# Patient Record
Sex: Male | Born: 1955 | Race: White | Hispanic: No | Marital: Married | Smoking: Never smoker
Health system: Southern US, Community
[De-identification: ages and names within clinical notes are randomized; demographics above are authoritative.]

## PROBLEM LIST (undated history)

## (undated) DIAGNOSIS — I10 Essential (primary) hypertension: Secondary | ICD-10-CM

## (undated) DIAGNOSIS — E785 Hyperlipidemia, unspecified: Secondary | ICD-10-CM

## (undated) HISTORY — DX: Hyperlipidemia, unspecified: E78.5

## (undated) HISTORY — PX: KIDNEY STONE SURGERY: SHX686

## (undated) HISTORY — DX: Essential (primary) hypertension: I10

---

## 2000-12-15 ENCOUNTER — Ambulatory Visit (HOSPITAL_COMMUNITY): Admission: RE | Admit: 2000-12-15 | Discharge: 2000-12-15 | Payer: Self-pay | Admitting: Neurology

## 2000-12-15 ENCOUNTER — Encounter: Payer: Self-pay | Admitting: Neurology

## 2011-05-29 ENCOUNTER — Telehealth: Payer: Self-pay

## 2011-05-29 NOTE — Telephone Encounter (Signed)
Received a message from Ginger that pt will call tomorrow after 4:30 pm.

## 2011-05-29 NOTE — Telephone Encounter (Signed)
LMOM at home to call.  

## 2011-06-10 NOTE — Telephone Encounter (Signed)
L/M to call.

## 2011-06-11 NOTE — Telephone Encounter (Signed)
Called pt's cell at [phone]. LMOM to call.

## 2011-06-11 NOTE — Telephone Encounter (Signed)
Mrs. Brailsford called and said to call pt on cell @ [phone].

## 2011-06-11 NOTE — Telephone Encounter (Signed)
LMOM to call.

## 2011-06-20 NOTE — Telephone Encounter (Signed)
Mailed letter to pt to call.  

## 2011-07-24 ENCOUNTER — Telehealth: Payer: Self-pay

## 2011-07-24 NOTE — Telephone Encounter (Signed)
LMOM to call.

## 2011-07-29 NOTE — Telephone Encounter (Signed)
LMOM for a return call. ( Note in referral said his last colonoscopy was done 03/30/2010, but he is not up to date. Please have pt explain.)

## 2011-08-12 NOTE — Telephone Encounter (Signed)
LMOM for a return call.  

## 2012-05-19 ENCOUNTER — Telehealth: Payer: Self-pay

## 2012-05-19 NOTE — Telephone Encounter (Signed)
L/M to call.

## 2012-06-16 NOTE — Telephone Encounter (Signed)
LMOM to call.

## 2012-07-09 NOTE — Telephone Encounter (Signed)
Letter to pt and PCP.  

## 2014-10-03 ENCOUNTER — Encounter: Payer: Self-pay | Admitting: Internal Medicine

## 2014-10-21 ENCOUNTER — Ambulatory Visit (INDEPENDENT_AMBULATORY_CARE_PROVIDER_SITE_OTHER): Payer: BLUE CROSS/BLUE SHIELD | Admitting: Gastroenterology

## 2014-10-21 ENCOUNTER — Other Ambulatory Visit: Payer: Self-pay

## 2014-10-21 ENCOUNTER — Encounter: Payer: Self-pay | Admitting: Gastroenterology

## 2014-10-21 VITALS — BP 146/85 | HR 58 | Temp 97.6°F | Ht 70.0 in | Wt 186.0 lb

## 2014-10-21 DIAGNOSIS — R195 Other fecal abnormalities: Secondary | ICD-10-CM | POA: Diagnosis not present

## 2014-10-21 MED ORDER — PEG 3350-KCL-NA BICARB-NACL 420 G PO SOLR: 4000.0000 mL | Freq: Once | ORAL | Status: DC

## 2014-10-21 NOTE — Assessment & Plan Note (Signed)
59 year old gentleman with Hemoccult-positive stool, void of any GI symptoms otherwise. No prior colonoscopy. Recommend diagnostic colonoscopy in the near future. I have discussed the risks, alternatives, benefits with regards to but not limited to the risk of reaction to medication, bleeding, infection, perforation and the patient is agreeable to proceed. Written consent to be obtained.

## 2014-10-21 NOTE — Progress Notes (Signed)
cc'ed to pcp °

## 2014-10-21 NOTE — Progress Notes (Signed)
Primary Care Physician:  Colette Ribas, MD  Primary Gastroenterologist:  Roetta Sessions, MD   Chief Complaint  Patient presents with  . Colonoscopy    HPI:  Isaiah Esparza is a 59 y.o. male here to schedule diagnostic colonoscopy. Heme positive 2 recently. Denies overt GI bleeding, melena. No constipation or diarrhea. Denies abdominal pain, anorexia, nausea or vomiting, heartburn.  Brother died from esophageal cancer. Sister had breast cancer. Another sister with unknown type cancer. No family history of colon cancer.  Current Outpatient Prescriptions  Medication Sig Dispense Refill  . amLODipine (NORVASC) 10 MG tablet   0  . atorvastatin (LIPITOR) 40 MG tablet   0   No current facility-administered medications for this visit.    Allergies as of 10/21/2014  . (No Known Allergies)    Past Medical History  Diagnosis Date  . Hyperlipidemia   . Hypertension     Past Surgical History  Procedure Laterality Date  . Kidney stone surgery      Family History  Problem Relation Age of Onset  . Heart disease Neg Hx   . Heart attack Neg Hx   . Breast cancer Sister   . Esophageal cancer Brother   . Cancer Sister     ?  . Colon cancer Neg Hx     Social History   Social History  . Marital Status: Married    Spouse Name: N/A  . Number of Children: 1  . Years of Education: N/A   Occupational History  . Not on file.   Social History Main Topics  . Smoking status: Never Smoker   . Smokeless tobacco: Not on file  . Alcohol Use: No  . Drug Use: No  . Sexual Activity: Not on file   Other Topics Concern  . Not on file   Social History Narrative  . No narrative on file      ROS:  General: Negative for anorexia, weight loss, fever, chills, fatigue, weakness. Eyes: Negative for vision changes.  ENT: Negative for hoarseness, difficulty swallowing , nasal congestion. CV: Negative for chest pain, angina, palpitations, dyspnea on exertion, peripheral edema.   Respiratory: Negative for dyspnea at rest, dyspnea on exertion, cough, sputum, wheezing.  GI: See history of present illness. GU:  Negative for dysuria, hematuria, urinary incontinence, urinary frequency, nocturnal urination.  MS: Negative for joint pain, low back pain.  Derm: Negative for rash or itching.  Neuro: Negative for weakness, abnormal sensation, seizure, frequent headaches, memory loss, confusion.  Psych: Negative for anxiety, depression, suicidal ideation, hallucinations.  Endo: Negative for unusual weight change.  Heme: Negative for bruising or bleeding. Allergy: Negative for rash or hives.    Physical Examination:  BP 146/85 mmHg  Pulse 58  Temp(Src) 97.6 F (36.4 C) (Oral)  Ht  (1.778 m)  Wt 186 lb (84.369 kg)  BMI 26.69 kg/m2   General: Well-nourished, well-developed in no acute distress.  Head: Normocephalic, atraumatic.   Eyes: Conjunctiva pink, no icterus. Mouth: Oropharyngeal mucosa moist and pink , no lesions erythema or exudate. Neck: Supple without thyromegaly, masses, or lymphadenopathy.  Lungs: Clear to auscultation bilaterally.  Heart: Regular rate and rhythm, no murmurs rubs or gallops.  Abdomen: Bowel sounds are normal, nontender, nondistended, no hepatosplenomegaly or masses, no abdominal bruits or    hernia , no rebound or guarding.   Rectal: Deferred Extremities: No lower extremity edema. No clubbing or deformities.  Neuro: Alert and oriented x 4 , grossly normal neurologically.  Skin:  Warm and dry, no rash or jaundice.   Psych: Alert and cooperative, normal mood and affect.    Imaging Studies: No results found.

## 2014-10-21 NOTE — Patient Instructions (Signed)
Colonoscopy with Dr. Rourk. See separate instructions. 

## 2014-11-14 ENCOUNTER — Encounter (HOSPITAL_COMMUNITY): Payer: Self-pay | Admitting: *Deleted

## 2014-11-14 ENCOUNTER — Encounter (HOSPITAL_COMMUNITY)
Admission: RE | Disposition: A | Discharge status: Home Health | Payer: Self-pay | Source: Ambulatory Visit | Attending: Internal Medicine

## 2014-11-14 ENCOUNTER — Ambulatory Visit (HOSPITAL_COMMUNITY)
Admission: RE | Admit: 2014-11-14 | Discharge: 2014-11-14 | Disposition: A | Discharge status: Home Health | Payer: BLUE CROSS/BLUE SHIELD | Source: Ambulatory Visit | Attending: Internal Medicine | Admitting: Internal Medicine

## 2014-11-14 DIAGNOSIS — K648 Other hemorrhoids: Secondary | ICD-10-CM | POA: Diagnosis not present

## 2014-11-14 DIAGNOSIS — R195 Other fecal abnormalities: Secondary | ICD-10-CM

## 2014-11-14 DIAGNOSIS — E785 Hyperlipidemia, unspecified: Secondary | ICD-10-CM | POA: Diagnosis not present

## 2014-11-14 DIAGNOSIS — I10 Essential (primary) hypertension: Secondary | ICD-10-CM | POA: Insufficient documentation

## 2014-11-14 DIAGNOSIS — K5730 Diverticulosis of large intestine without perforation or abscess without bleeding: Secondary | ICD-10-CM | POA: Diagnosis not present

## 2014-11-14 DIAGNOSIS — K921 Melena: Secondary | ICD-10-CM | POA: Insufficient documentation

## 2014-11-14 HISTORY — PX: COLONOSCOPY: SHX5424

## 2014-11-14 LAB — CBC
HCT: 41.2 % (ref 39.0–52.0)
Hemoglobin: 14 g/dL (ref 13.0–17.0)
MCH: 30.6 pg (ref 26.0–34.0)
MCHC: 34 g/dL (ref 30.0–36.0)
MCV: 90.2 fL (ref 78.0–100.0)
Platelets: 161 10*3/uL (ref 150–400)
RBC: 4.57 MIL/uL (ref 4.22–5.81)
RDW: 13.4 % (ref 11.5–15.5)
WBC: 4.1 10*3/uL (ref 4.0–10.5)

## 2014-11-14 SURGERY — COLONOSCOPY
Anesthesia: Moderate Sedation

## 2014-11-14 MED ORDER — MEPERIDINE HCL 100 MG/ML IJ SOLN
INTRAMUSCULAR | Status: DC | PRN
  Administered 2014-11-14 (×2): 25 mg via INTRAVENOUS
  Administered 2014-11-14: 50 mg via INTRAVENOUS

## 2014-11-14 MED ORDER — SODIUM CHLORIDE 0.9 % IV SOLN
INTRAVENOUS | Status: DC
  Administered 2014-11-14: 09:00:00 via INTRAVENOUS

## 2014-11-14 MED ORDER — MIDAZOLAM HCL 5 MG/5ML IJ SOLN
INTRAMUSCULAR | Status: AC
  Filled 2014-11-14: qty 10

## 2014-11-14 MED ORDER — STERILE WATER FOR IRRIGATION IR SOLN
Status: DC | PRN
  Administered 2014-11-14: 09:00:00

## 2014-11-14 MED ORDER — MIDAZOLAM HCL 5 MG/5ML IJ SOLN
INTRAMUSCULAR | Status: DC | PRN
Start: 2014-11-14 — End: 2014-11-14
  Administered 2014-11-14 (×2): 1 mg via INTRAVENOUS
  Administered 2014-11-14: 2 mg via INTRAVENOUS

## 2014-11-14 MED ORDER — MEPERIDINE HCL 100 MG/ML IJ SOLN
INTRAMUSCULAR | Status: DC
Start: 2014-11-14 — End: 2014-11-14
  Filled 2014-11-14: qty 2

## 2014-11-14 MED ORDER — ONDANSETRON HCL 4 MG/2ML IJ SOLN
INTRAMUSCULAR | Status: DC | PRN
  Administered 2014-11-14: 4 mg via INTRAVENOUS

## 2014-11-14 MED ORDER — ONDANSETRON HCL 4 MG/2ML IJ SOLN
INTRAMUSCULAR | Status: AC
  Filled 2014-11-14: qty 2

## 2014-11-14 NOTE — Discharge Instructions (Signed)
Colonoscopy Discharge Instructions  Read the instructions outlined below and refer to this sheet in the next few weeks. These discharge instructions provide you with general information on caring for yourself after you leave the hospital. Your doctor may also give you specific instructions. While your treatment has been planned according to the most current medical practices available, unavoidable complications occasionally occur. If you have any problems or questions after discharge, call Dr. Jena Gauss at [phone]. ACTIVITY  You may resume your regular activity, but move at a slower pace for the next 24 hours.   Take frequent rest periods for the next 24 hours.   Walking will help get rid of the air and reduce the bloated feeling in your belly (abdomen).   No driving for 24 hours (because of the medicine (anesthesia) used during the test).    Do not sign any important legal documents or operate any machinery for 24 hours (because of the anesthesia used during the test).  NUTRITION  Drink plenty of fluids.   You may resume your normal diet as instructed by your doctor.   Begin with a light meal and progress to your normal diet. Heavy or fried foods are harder to digest and may make you feel sick to your stomach (nauseated).   Avoid alcoholic beverages for 24 hours or as instructed.  MEDICATIONS  You may resume your normal medications unless your doctor tells you otherwise.  WHAT YOU CAN EXPECT TODAY  Some feelings of bloating in the abdomen.   Passage of more gas than usual.   Spotting of blood in your stool or on the toilet paper.  IF YOU HAD POLYPS REMOVED DURING THE COLONOSCOPY:  No aspirin products for 7 days or as instructed.   No alcohol for 7 days or as instructed.   Eat a soft diet for the next 24 hours.  FINDING OUT THE RESULTS OF YOUR TEST Not all test results are available during your visit. If your test results are not back during the visit, make an appointment  with your caregiver to find out the results. Do not assume everything is normal if you have not heard from your caregiver or the medical facility. It is important for you to follow up on all of your test results.  SEEK IMMEDIATE MEDICAL ATTENTION IF:  You have more than a spotting of blood in your stool.   Your belly is swollen (abdominal distention).   You are nauseated or vomiting.   You have a temperature over 101.   You have abdominal pain or discomfort that is severe or gets worse throughout the day.     Diverticulosis information  CBC today  Repeat colonoscopy for screening purposes in 10 years  Diverticulosis Diverticulosis is the condition that develops when small pouches (diverticula) form in the wall of your colon. Your colon, or large intestine, is where water is absorbed and stool is formed. The pouches form when the inside layer of your colon pushes through weak spots in the outer layers of your colon. CAUSES  No one knows exactly what causes diverticulosis. RISK FACTORS  Being older than 50. Your risk for this condition increases with age. Diverticulosis is rare in people younger than 40 years. By age 30, almost everyone has it.  Eating a low-fiber diet.  Being frequently constipated.  Being overweight.  Not getting enough exercise.  Smoking.  Taking over-the-counter pain medicines, like aspirin and ibuprofen. SYMPTOMS  Most people with diverticulosis do not have symptoms. DIAGNOSIS  Because  diverticulosis often has no symptoms, health care providers often discover the condition during an exam for other colon problems. In many cases, a health care provider will diagnose diverticulosis while using a flexible scope to examine the colon (colonoscopy). TREATMENT  If you have never developed an infection related to diverticulosis, you may not need treatment. If you have had an infection before, treatment may include:  Eating more fruits, vegetables, and  grains.  Taking a fiber supplement.  Taking a live bacteria supplement (probiotic).  Taking medicine to relax your colon. HOME CARE INSTRUCTIONS   Drink at least 6-8 glasses of water each day to prevent constipation.  Try not to strain when you have a bowel movement.  Keep all follow-up appointments. If you have had an infection before:  Increase the fiber in your diet as directed by your health care provider or dietitian.  Take a dietary fiber supplement if your health care provider approves.  Only take medicines as directed by your health care provider. SEEK MEDICAL CARE IF:   You have abdominal pain.  You have bloating.  You have cramps.  You have not gone to the bathroom in 3 days. SEEK IMMEDIATE MEDICAL CARE IF:   Your pain gets worse.  Yourbloating becomes very bad.  You have a fever or chills, and your symptoms suddenly get worse.  You begin vomiting.  You have bowel movements that are bloody or black. MAKE SURE YOU:  Understand these instructions.  Will watch your condition.  Will get help right away if you are not doing well or get worse.

## 2014-11-14 NOTE — Op Note (Signed)
[facility] [address] [place], [address]   COLONOSCOPY PROCEDURE REPORT  PATIENT: Isaiah, Esparza  MR#: [id number] BIRTHDATE: 1955/06/27 , 59  yrs. old GENDER: male ENDOSCOPIST: R.  Roetta Sessions, MD FACP [facility] REFERRED XB:JYNW Phillips Odor, M.D. PROCEDURE DATE:  2014-11-23 PROCEDURE:   Ileo-colonoscopy, diagnostic INDICATIONS:   Hemoccult-positive stool MEDICATIONS: Versed 4 mg IV and Demerol 100 mg IV in divided doses. Zofran 4 mg IV. ASA CLASS:       Class II  CONSENT: The risks, benefits, alternatives and imponderables including but not limited to bleeding, perforation as well as the possibility of a missed lesion have been reviewed.  The potential for biopsy, lesion removal, etc. have also been discussed. Questions have been answered.  All parties agreeable.  Please see the history and physical in the medical record for more information.  DESCRIPTION OF PROCEDURE:   After the risks benefits and alternatives of the procedure were thoroughly explained, informed consent was obtained.  The digital rectal exam revealed no abnormalities of the rectum.   The EC-[id number] ([id number])  endoscope was introduced through the anus and advanced to the terminal ileum which was intubated for a short distance. No adverse events experienced.   The quality of the prep was adequate  The instrument was then slowly withdrawn as the colon was fully examined. Estimated blood loss is zero unless otherwise noted in this procedure report.      COLON FINDINGS: Minimal anal canal/internal hemorrhoids; otherwise, normal-appearing rectal mucosa.  Rare sigmoid diverticula; otherwise, the remainder of the colonic mucosa appeared entirely normal.  The distal 10 cm of terminal ileum mucosa also appeared normal.  Retroflexion was performed. .  Withdrawal time=13 minutes 0 seconds.  The scope was withdrawn and the procedure completed. COMPLICATIONS: There were no immediate  complications.  ENDOSCOPIC IMPRESSION: Minimal internal hemorrhoids. Rare sigmoid diverticula; otherwise normal examination.  RECOMMENDATIONS: Repeat colonoscopy in 10 years for screening purposes. CBC today. Further recommendations to follow.  eSigned:  R. Roetta Sessions, MD Jerrel Ivory [facility] 11-23-14 9:40 AM   cc:  CPT CODES: ICD CODES:  The ICD and CPT codes recommended by this software are interpretations from the data that the clinical staff has captured with the software.  The verification of the translation of this report to the ICD and CPT codes and modifiers is the sole responsibility of the health care institution and practicing physician where this report was generated.  [employer] will not be held responsible for the validity of the ICD and CPT codes included on this report.  AMA assumes no liability for data contained or not contained herein. CPT is a Publishing rights manager of the [employer].  PATIENT NAME:  Isaiah, Esparza MR#: [id number]

## 2014-11-14 NOTE — Interval H&P Note (Signed)
History and Physical Interval Note:  11/14/2014 8:56 AM  Isaiah Esparza  has presented today for surgery, with the diagnosis of heme postive stool  The various methods of treatment have been discussed with the patient and family. After consideration of risks, benefits and other options for treatment, the patient has consented to  Procedure(s) with comments: COLONOSCOPY (N/A) - 0930 as a surgical intervention .  The patient's history has been reviewed, patient examined, no change in status, stable for surgery.  I have reviewed the patient's chart and labs.  Questions were answered to the patient's satisfaction.     Isaiah Esparza  No change. A diagnostic colonoscopy for Hemoccult-positive stool per plan. n The risks, benefits, limitations, alternatives and imponderables have been reviewed with the patient. Questions have been answered. All parties are agreeable.

## 2014-11-14 NOTE — H&P (View-Only) (Signed)
Primary Care Physician:  Colette Ribas, MD  Primary Gastroenterologist:  Roetta Sessions, MD   Chief Complaint  Patient presents with  . Colonoscopy    HPI:  Isaiah Esparza is a 59 y.o. male here to schedule diagnostic colonoscopy. Heme positive 2 recently. Denies overt GI bleeding, melena. No constipation or diarrhea. Denies abdominal pain, anorexia, nausea or vomiting, heartburn.  Brother died from esophageal cancer. Sister had breast cancer. Another sister with unknown type cancer. No family history of colon cancer.  Current Outpatient Prescriptions  Medication Sig Dispense Refill  . amLODipine (NORVASC) 10 MG tablet   0  . atorvastatin (LIPITOR) 40 MG tablet   0   No current facility-administered medications for this visit.    Allergies as of 10/21/2014  . (No Known Allergies)    Past Medical History  Diagnosis Date  . Hyperlipidemia   . Hypertension     Past Surgical History  Procedure Laterality Date  . Kidney stone surgery      Family History  Problem Relation Age of Onset  . Heart disease Neg Hx   . Heart attack Neg Hx   . Breast cancer Sister   . Esophageal cancer Brother   . Cancer Sister     ?  . Colon cancer Neg Hx     Social History   Social History  . Marital Status: Married    Spouse Name: N/A  . Number of Children: 1  . Years of Education: N/A   Occupational History  . Not on file.   Social History Main Topics  . Smoking status: Never Smoker   . Smokeless tobacco: Not on file  . Alcohol Use: No  . Drug Use: No  . Sexual Activity: Not on file   Other Topics Concern  . Not on file   Social History Narrative  . No narrative on file      ROS:  General: Negative for anorexia, weight loss, fever, chills, fatigue, weakness. Eyes: Negative for vision changes.  ENT: Negative for hoarseness, difficulty swallowing , nasal congestion. CV: Negative for chest pain, angina, palpitations, dyspnea on exertion, peripheral edema.   Respiratory: Negative for dyspnea at rest, dyspnea on exertion, cough, sputum, wheezing.  GI: See history of present illness. GU:  Negative for dysuria, hematuria, urinary incontinence, urinary frequency, nocturnal urination.  MS: Negative for joint pain, low back pain.  Derm: Negative for rash or itching.  Neuro: Negative for weakness, abnormal sensation, seizure, frequent headaches, memory loss, confusion.  Psych: Negative for anxiety, depression, suicidal ideation, hallucinations.  Endo: Negative for unusual weight change.  Heme: Negative for bruising or bleeding. Allergy: Negative for rash or hives.    Physical Examination:  BP 146/85 mmHg  Pulse 58  Temp(Src) 97.6 F (36.4 C) (Oral)  Ht  (1.778 m)  Wt 186 lb (84.369 kg)  BMI 26.69 kg/m2   General: Well-nourished, well-developed in no acute distress.  Head: Normocephalic, atraumatic.   Eyes: Conjunctiva pink, no icterus. Mouth: Oropharyngeal mucosa moist and pink , no lesions erythema or exudate. Neck: Supple without thyromegaly, masses, or lymphadenopathy.  Lungs: Clear to auscultation bilaterally.  Heart: Regular rate and rhythm, no murmurs rubs or gallops.  Abdomen: Bowel sounds are normal, nontender, nondistended, no hepatosplenomegaly or masses, no abdominal bruits or    hernia , no rebound or guarding.   Rectal: Deferred Extremities: No lower extremity edema. No clubbing or deformities.  Neuro: Alert and oriented x 4 , grossly normal neurologically.  Skin:  Warm and dry, no rash or jaundice.   Psych: Alert and cooperative, normal mood and affect.    Imaging Studies: No results found.    

## 2014-11-16 ENCOUNTER — Encounter (HOSPITAL_COMMUNITY): Payer: Self-pay | Admitting: Internal Medicine

## 2014-12-08 ENCOUNTER — Ambulatory Visit (INDEPENDENT_AMBULATORY_CARE_PROVIDER_SITE_OTHER): Payer: BLUE CROSS/BLUE SHIELD | Admitting: *Deleted

## 2014-12-08 DIAGNOSIS — Z23 Encounter for immunization: Secondary | ICD-10-CM

## 2015-06-01 DIAGNOSIS — I10 Essential (primary) hypertension: Secondary | ICD-10-CM | POA: Diagnosis not present

## 2015-06-01 DIAGNOSIS — Z1389 Encounter for screening for other disorder: Secondary | ICD-10-CM | POA: Diagnosis not present

## 2015-06-01 DIAGNOSIS — Z6829 Body mass index (BMI) 29.0-29.9, adult: Secondary | ICD-10-CM | POA: Diagnosis not present

## 2015-06-01 DIAGNOSIS — E782 Mixed hyperlipidemia: Secondary | ICD-10-CM | POA: Diagnosis not present

## 2015-08-11 DIAGNOSIS — H5203 Hypermetropia, bilateral: Secondary | ICD-10-CM | POA: Diagnosis not present

## 2016-05-17 DIAGNOSIS — Z1389 Encounter for screening for other disorder: Secondary | ICD-10-CM | POA: Diagnosis not present

## 2016-05-17 DIAGNOSIS — Z0001 Encounter for general adult medical examination with abnormal findings: Secondary | ICD-10-CM | POA: Diagnosis not present

## 2016-05-17 DIAGNOSIS — Z6829 Body mass index (BMI) 29.0-29.9, adult: Secondary | ICD-10-CM | POA: Diagnosis not present

## 2016-05-17 DIAGNOSIS — E663 Overweight: Secondary | ICD-10-CM | POA: Diagnosis not present

## 2016-06-04 DIAGNOSIS — D649 Anemia, unspecified: Secondary | ICD-10-CM | POA: Diagnosis not present

## 2016-07-18 DIAGNOSIS — E663 Overweight: Secondary | ICD-10-CM | POA: Diagnosis not present

## 2016-07-18 DIAGNOSIS — I10 Essential (primary) hypertension: Secondary | ICD-10-CM | POA: Diagnosis not present

## 2016-07-18 DIAGNOSIS — Z1389 Encounter for screening for other disorder: Secondary | ICD-10-CM | POA: Diagnosis not present

## 2016-07-18 DIAGNOSIS — E782 Mixed hyperlipidemia: Secondary | ICD-10-CM | POA: Diagnosis not present

## 2016-07-18 DIAGNOSIS — Z6829 Body mass index (BMI) 29.0-29.9, adult: Secondary | ICD-10-CM | POA: Diagnosis not present

## 2016-09-20 DIAGNOSIS — Z1389 Encounter for screening for other disorder: Secondary | ICD-10-CM | POA: Diagnosis not present

## 2016-09-20 DIAGNOSIS — E663 Overweight: Secondary | ICD-10-CM | POA: Diagnosis not present

## 2016-09-20 DIAGNOSIS — Z6829 Body mass index (BMI) 29.0-29.9, adult: Secondary | ICD-10-CM | POA: Diagnosis not present

## 2016-09-20 DIAGNOSIS — I10 Essential (primary) hypertension: Secondary | ICD-10-CM | POA: Diagnosis not present

## 2016-09-20 DIAGNOSIS — R600 Localized edema: Secondary | ICD-10-CM | POA: Diagnosis not present

## 2016-11-28 DIAGNOSIS — Z23 Encounter for immunization: Secondary | ICD-10-CM | POA: Diagnosis not present

## 2017-03-14 DIAGNOSIS — E782 Mixed hyperlipidemia: Secondary | ICD-10-CM | POA: Diagnosis not present

## 2017-03-14 DIAGNOSIS — Z6829 Body mass index (BMI) 29.0-29.9, adult: Secondary | ICD-10-CM | POA: Diagnosis not present

## 2017-03-14 DIAGNOSIS — Z6830 Body mass index (BMI) 30.0-30.9, adult: Secondary | ICD-10-CM | POA: Diagnosis not present

## 2017-03-14 DIAGNOSIS — Z1389 Encounter for screening for other disorder: Secondary | ICD-10-CM | POA: Diagnosis not present

## 2017-03-14 DIAGNOSIS — I10 Essential (primary) hypertension: Secondary | ICD-10-CM | POA: Diagnosis not present

## 2017-03-14 DIAGNOSIS — R42 Dizziness and giddiness: Secondary | ICD-10-CM | POA: Diagnosis not present

## 2017-03-14 DIAGNOSIS — E6609 Other obesity due to excess calories: Secondary | ICD-10-CM | POA: Diagnosis not present

## 2017-04-03 DIAGNOSIS — E6609 Other obesity due to excess calories: Secondary | ICD-10-CM | POA: Diagnosis not present

## 2017-04-03 DIAGNOSIS — Z6830 Body mass index (BMI) 30.0-30.9, adult: Secondary | ICD-10-CM | POA: Diagnosis not present

## 2017-04-03 DIAGNOSIS — M67823 Other specified disorders of tendon, right elbow: Secondary | ICD-10-CM | POA: Diagnosis not present

## 2017-05-23 DIAGNOSIS — I10 Essential (primary) hypertension: Secondary | ICD-10-CM | POA: Diagnosis not present

## 2017-05-23 DIAGNOSIS — M7711 Lateral epicondylitis, right elbow: Secondary | ICD-10-CM | POA: Diagnosis not present

## 2017-05-23 DIAGNOSIS — Z6830 Body mass index (BMI) 30.0-30.9, adult: Secondary | ICD-10-CM | POA: Diagnosis not present

## 2017-05-23 DIAGNOSIS — Z1389 Encounter for screening for other disorder: Secondary | ICD-10-CM | POA: Diagnosis not present

## 2017-05-23 DIAGNOSIS — Z0001 Encounter for general adult medical examination with abnormal findings: Secondary | ICD-10-CM | POA: Diagnosis not present

## 2017-06-11 DIAGNOSIS — Z1389 Encounter for screening for other disorder: Secondary | ICD-10-CM | POA: Diagnosis not present

## 2017-06-11 DIAGNOSIS — E663 Overweight: Secondary | ICD-10-CM | POA: Diagnosis not present

## 2017-06-11 DIAGNOSIS — H109 Unspecified conjunctivitis: Secondary | ICD-10-CM | POA: Diagnosis not present

## 2017-06-11 DIAGNOSIS — Z6829 Body mass index (BMI) 29.0-29.9, adult: Secondary | ICD-10-CM | POA: Diagnosis not present

## 2018-06-12 DIAGNOSIS — E663 Overweight: Secondary | ICD-10-CM | POA: Diagnosis not present

## 2018-06-12 DIAGNOSIS — E7849 Other hyperlipidemia: Secondary | ICD-10-CM | POA: Diagnosis not present

## 2018-06-12 DIAGNOSIS — I10 Essential (primary) hypertension: Secondary | ICD-10-CM | POA: Diagnosis not present

## 2018-06-12 DIAGNOSIS — Z1389 Encounter for screening for other disorder: Secondary | ICD-10-CM | POA: Diagnosis not present

## 2018-06-12 DIAGNOSIS — Z6827 Body mass index (BMI) 27.0-27.9, adult: Secondary | ICD-10-CM | POA: Diagnosis not present

## 2018-06-12 DIAGNOSIS — Z0001 Encounter for general adult medical examination with abnormal findings: Secondary | ICD-10-CM | POA: Diagnosis not present

## 2018-06-12 DIAGNOSIS — L600 Ingrowing nail: Secondary | ICD-10-CM | POA: Diagnosis not present

## 2018-11-04 DIAGNOSIS — D649 Anemia, unspecified: Secondary | ICD-10-CM | POA: Diagnosis not present

## 2018-11-04 DIAGNOSIS — E6609 Other obesity due to excess calories: Secondary | ICD-10-CM | POA: Diagnosis not present

## 2018-11-04 DIAGNOSIS — E7849 Other hyperlipidemia: Secondary | ICD-10-CM | POA: Diagnosis not present

## 2018-11-04 DIAGNOSIS — I10 Essential (primary) hypertension: Secondary | ICD-10-CM | POA: Diagnosis not present

## 2018-11-06 DIAGNOSIS — Z1389 Encounter for screening for other disorder: Secondary | ICD-10-CM | POA: Diagnosis not present

## 2018-11-06 DIAGNOSIS — D649 Anemia, unspecified: Secondary | ICD-10-CM | POA: Diagnosis not present

## 2018-11-20 DIAGNOSIS — D649 Anemia, unspecified: Secondary | ICD-10-CM | POA: Diagnosis not present

## 2018-11-27 DIAGNOSIS — Z1211 Encounter for screening for malignant neoplasm of colon: Secondary | ICD-10-CM | POA: Diagnosis not present

## 2018-12-02 DIAGNOSIS — Z23 Encounter for immunization: Secondary | ICD-10-CM | POA: Diagnosis not present

## 2019-01-28 DIAGNOSIS — Z20828 Contact with and (suspected) exposure to other viral communicable diseases: Secondary | ICD-10-CM | POA: Diagnosis not present

## 2019-02-01 DIAGNOSIS — Z20828 Contact with and (suspected) exposure to other viral communicable diseases: Secondary | ICD-10-CM | POA: Diagnosis not present

## 2021-07-27 ENCOUNTER — Other Ambulatory Visit: Payer: Self-pay

## 2021-07-27 ENCOUNTER — Encounter (HOSPITAL_COMMUNITY): Payer: Self-pay | Admitting: Emergency Medicine

## 2021-07-27 ENCOUNTER — Emergency Department (HOSPITAL_COMMUNITY)
Admission: EM | Admit: 2021-07-27 | Discharge: 2021-07-27 | Disposition: A | Discharge status: Home / Self Care | Payer: 59 | Attending: Emergency Medicine | Admitting: Emergency Medicine

## 2021-07-27 ENCOUNTER — Emergency Department (HOSPITAL_COMMUNITY): Payer: 59

## 2021-07-27 DIAGNOSIS — W1830XA Fall on same level, unspecified, initial encounter: Secondary | ICD-10-CM | POA: Insufficient documentation

## 2021-07-27 DIAGNOSIS — I10 Essential (primary) hypertension: Secondary | ICD-10-CM | POA: Diagnosis not present

## 2021-07-27 DIAGNOSIS — S52122A Displaced fracture of head of left radius, initial encounter for closed fracture: Secondary | ICD-10-CM | POA: Diagnosis not present

## 2021-07-27 DIAGNOSIS — S52109A Unspecified fracture of upper end of unspecified radius, initial encounter for closed fracture: Secondary | ICD-10-CM

## 2021-07-27 DIAGNOSIS — Y92007 Garden or yard of unspecified non-institutional (private) residence as the place of occurrence of the external cause: Secondary | ICD-10-CM | POA: Diagnosis not present

## 2021-07-27 DIAGNOSIS — S59902A Unspecified injury of left elbow, initial encounter: Secondary | ICD-10-CM | POA: Diagnosis present

## 2021-07-27 MED ORDER — HYDROCODONE-ACETAMINOPHEN 5-325 MG PO TABS: ORAL_TABLET | ORAL | 0 refills | Status: DC

## 2021-07-27 NOTE — Discharge Instructions (Signed)
Your x-ray shows that you have a broken bone near your left elbow.  Keep the sling in place.  Someone from the orthopedic office will call you on Monday to arrange a appointment for Tuesday or Wednesday.  If you do not hear from someone by Monday afternoon please call the office to arrange an appointment.  You may apply ice packs on and off to your elbow area.  Elevate when possible.

## 2021-07-27 NOTE — ED Provider Notes (Signed)
[facility] EMERGENCY DEPARTMENT Provider Note   CSN: [id number] Arrival date & time: 07/27/21  1619     History  Chief Complaint  Patient presents with   Elbow Pain    Isaiah Esparza is a 66 y.o. male.  HPI      Isaiah Esparza is a 66 y.o. male with past medical history of hyperlipidemia and hypertension who presents to the Emergency Department complaining of left elbow/upper forearm pain after a mechanical fall that occurred earlier today.  He states he fell in the yard while using a leaf blower and landed on his left arm.  Describes pain associated with movement of his elbow and with palpation of the proximal forearm.  He denies any shoulder or wrist pain.  No head injury or LOC.  He denies any numbness or tingling of his fingers does not take blood thinners.   Home Medications Prior to Admission medications   Medication Sig Start Date End Date Taking? Authorizing Provider  amLODipine (NORVASC) 10 MG tablet Take 10 mg by mouth daily.  07/19/14   [provider]  atorvastatin (LIPITOR) 40 MG tablet Take 40 mg by mouth at bedtime.  08/03/14   [provider]  polyethylene glycol-electrolytes (NULYTELY/GOLYTELY) 420 G solution Take 4,000 mLs by mouth once. 10/21/14   Tiffany Kocher, PA-C      Allergies    Patient has no known allergies.    Review of Systems   Review of Systems  Constitutional:  Negative for fever.  Respiratory:  Negative for shortness of breath.   Cardiovascular:  Negative for chest pain.  Gastrointestinal:  Negative for nausea and vomiting.  Musculoskeletal:  Positive for arthralgias (Left elbow forearm pain). Negative for back pain, neck pain and neck stiffness.  Skin:  Negative for color change.  Neurological:  Negative for dizziness, syncope, weakness, numbness and headaches.    Physical Exam Updated Vital Signs BP (!) 154/93 (BP Location: Right Arm)   Pulse 99   Temp 97.7 F (36.5 C) (Oral)   Resp 18   Ht 5\' 11"  (1.803 m)    Wt 95.3 kg   SpO2 95%   BMI 29.29 kg/m  Physical Exam Vitals and nursing note reviewed.  Constitutional:      General: He is not in acute distress.    Appearance: He is not ill-appearing or toxic-appearing.  Cardiovascular:     Rate and Rhythm: Normal rate and regular rhythm.     Pulses: Normal pulses.  Pulmonary:     Effort: Pulmonary effort is normal.  Musculoskeletal:        General: Swelling, tenderness and signs of injury present. No deformity.     Left elbow: No deformity. Normal range of motion. No tenderness.     Left forearm: Swelling and tenderness present.     Cervical back: Normal range of motion. No tenderness.     Comments: Tender to palpation of the proximal left forearm, mild edema noted.  Compartments are soft.  No tenderness of the lateral epicondyles.  No bony deformity.  Upper arm and shoulder are nontender.  Skin:    General: Skin is warm.     Capillary Refill: Capillary refill takes less than 2 seconds.     Findings: No rash.  Neurological:     General: No focal deficit present.     Mental Status: He is alert.     Sensory: No sensory deficit.     Motor: No weakness.     ED  Results / Procedures / Treatments   Labs (all labs ordered are listed, but only abnormal results are displayed) Labs Reviewed - No data to display  EKG None  Radiology DG Elbow Complete Left  Result Date: 07/27/2021 CLINICAL DATA:  Status post fall. EXAM: LEFT ELBOW - COMPLETE 3+ VIEW COMPARISON:  None Available. FINDINGS: An ill-defined area of cortical irregularity is seen involving the neck of the proximal left radius. There is no evidence of dislocation. There is no evidence of arthropathy or other focal bone abnormality. Soft tissues are unremarkable. IMPRESSION: Findings suspicious for a nondisplaced fracture of the proximal left radius. Electronically Signed   By: Aram Candela M.D.   On: 07/27/2021 17:06   DG Forearm Left  Result Date: 07/27/2021 CLINICAL DATA:   Status post fall. EXAM: LEFT FOREARM - 2 VIEW COMPARISON:  None Available. FINDINGS: An acute nondisplaced fracture is seen involving the neck of the proximal left radius. There is no evidence of dislocation. Soft tissues are unremarkable. IMPRESSION: Nondisplaced fracture of the proximal left radius. Electronically Signed   By: Aram Candela M.D.   On: 07/27/2021 16:59    Procedures Procedures    Medications Ordered in ED Medications - No data to display  ED Course/ Medical Decision Making/ A&P                           Medical Decision Making Amount and/or Complexity of Data Reviewed Radiology: ordered.   Patient here for evaluation of left elbow and forearm pain after mechanical fall earlier today.  Denies other injuries.  Does not take blood thinners.  On exam, patient well-appearing nontoxic.  He has focal tenderness to the proximal left forearm.  No tenderness over the lateral epicondyles.  No bony deformity on exam.  Neurovascularly intact.  He is able to range of motion with the left elbow, but produces pain.  X-ray of the elbow shows nondisplaced fracture of the proximal left radius at the level of the neck.  I discussed findings with orthopedics, Dr. Aretha Parrot who recommends patient have sling and he will follow-up in clinic Tuesday or Wednesday of next week.  Discussed this with patient and he is agreeable to plan.        Final Clinical Impression(s) / ED Diagnoses Final diagnoses:  Closed fracture of proximal end of radius without additional fracture    Rx / DC Orders ED Discharge Orders     None         Pauline Aus, PA-C 07/27/21 Pricilla Riffle, MD 07/30/21 1241

## 2021-07-27 NOTE — ED Triage Notes (Signed)
Pt to ER states he fell in the yard landing on concrete on his left arm.  Pt denies hitting head or LOC.  Pt endorses worst pain to elbow.

## 2021-07-31 ENCOUNTER — Encounter: Payer: Self-pay | Admitting: Orthopedic Surgery

## 2021-07-31 ENCOUNTER — Ambulatory Visit (INDEPENDENT_AMBULATORY_CARE_PROVIDER_SITE_OTHER): Payer: 59 | Admitting: Orthopedic Surgery

## 2021-07-31 VITALS — BP 140/89 | HR 72 | Ht 71.0 in | Wt 210.0 lb

## 2021-07-31 DIAGNOSIS — M25522 Pain in left elbow: Secondary | ICD-10-CM | POA: Diagnosis not present

## 2021-07-31 NOTE — Progress Notes (Signed)
New Patient Visit  Assessment: Isaiah Esparza is a 66 y.o. male with the following: 1. Pain in left elbow  Plan: Isaiah Esparza sustained a fall in the last week.  He has some bruising on the volar aspect the left forearm.  Radiographs suggested possible fracture at the base of the radial head.  He is not tender in this area.  His pain is primarily musculature at this point.  He does not need to continue using the sling.  Medications as needed.  Work on range of motion.  Anticipate strength and range of motion will gradually resolve.  If he continues to have issues I have asked him to contact clinic.  Follow-up as needed.  Follow-up: Return if symptoms worsen or fail to improve.  Subjective:  Chief Complaint  Patient presents with   Elbow Pain    Left fell on 07/27/21    History of Present Illness: Isaiah Esparza is a 66 y.o. male who presents for evaluation of left elbow pain.  Just a few days ago, he states he lost his balance, and fell directly onto his left elbow.  He had pain.  He presented to the emergency department.  Radiographs were concerning for fracture.  He has been wearing a sling.  He is not taking medications.  He noticed bruising over the volar forearm.  He has some tenderness within the muscles of the proximal forearm.  No numbness or tingling.   Review of Systems: No fevers or chills No numbness or tingling No chest pain No shortness of breath No bowel or bladder dysfunction No GI distress No headaches   Medical History:  Past Medical History:  Diagnosis Date   Hyperlipidemia    Hypertension     Past Surgical History:  Procedure Laterality Date   COLONOSCOPY N/A 11/14/2014   Procedure: COLONOSCOPY;  Surgeon: Corbin Ade, MD;  Location: [facility];  Service: Endoscopy;  Laterality: N/A;  0930   KIDNEY STONE SURGERY      Family History  Problem Relation Age of Onset   Heart disease Neg Hx    Heart attack Neg Hx    Breast cancer Sister     Esophageal cancer Brother    Cancer Sister        ?   Colon cancer Neg Hx    Social History   Tobacco Use   Smoking status: Never  Substance Use Topics   Alcohol use: No    Alcohol/week: 0.0 standard drinks of alcohol   Drug use: No    No Known Allergies  Current Meds  Medication Sig   amlodipine-olmesartan (AZOR) 10-20 MG tablet Take 1 tablet by mouth daily.   atorvastatin (LIPITOR) 40 MG tablet Take 40 mg by mouth at bedtime.     Objective: BP 140/89   Pulse 72   Ht 5\' 11"  (1.803 m)   Wt 210 lb (95.3 kg)   BMI 29.29 kg/m   Physical Exam:  General: Alert and oriented. and No acute distress. Gait: Normal gait.  Left arm without swelling.  There is ecchymosis over the proximal volar forearm.  Tenderness to palpation within this area.  Tenderness within the whole lot of the left forearm.  No tenderness to palpation directly over the radial head.  Near full range of motion of the left elbow.  Sensation is intact throughout the left hand.  2+ radial pulse.  IMAGING: I personally reviewed images previously obtained from the ED  X-ray of the left  elbow was obtained in the emergency department.  No dislocation.  Possible cortical disruption at the base of the radial head, otherwise x-rays are negative.  New Medications:  No orders of the defined types were placed in this encounter.     Oliver Barre, MD  07/31/2021 4:57 PM

## 2022-05-30 DIAGNOSIS — E6609 Other obesity due to excess calories: Secondary | ICD-10-CM | POA: Diagnosis not present

## 2022-05-30 DIAGNOSIS — I10 Essential (primary) hypertension: Secondary | ICD-10-CM | POA: Diagnosis not present

## 2022-05-30 DIAGNOSIS — Z1331 Encounter for screening for depression: Secondary | ICD-10-CM | POA: Diagnosis not present

## 2022-05-30 DIAGNOSIS — E782 Mixed hyperlipidemia: Secondary | ICD-10-CM | POA: Diagnosis not present

## 2022-05-30 DIAGNOSIS — Z6831 Body mass index (BMI) 31.0-31.9, adult: Secondary | ICD-10-CM | POA: Diagnosis not present

## 2022-05-30 DIAGNOSIS — M542 Cervicalgia: Secondary | ICD-10-CM | POA: Diagnosis not present

## 2022-09-13 DIAGNOSIS — E782 Mixed hyperlipidemia: Secondary | ICD-10-CM | POA: Diagnosis not present

## 2022-09-13 DIAGNOSIS — D485 Neoplasm of uncertain behavior of skin: Secondary | ICD-10-CM | POA: Diagnosis not present

## 2022-09-13 DIAGNOSIS — Z6829 Body mass index (BMI) 29.0-29.9, adult: Secondary | ICD-10-CM | POA: Diagnosis not present

## 2022-09-13 DIAGNOSIS — I10 Essential (primary) hypertension: Secondary | ICD-10-CM | POA: Diagnosis not present

## 2022-09-13 DIAGNOSIS — E6609 Other obesity due to excess calories: Secondary | ICD-10-CM | POA: Diagnosis not present

## 2022-09-13 DIAGNOSIS — E7849 Other hyperlipidemia: Secondary | ICD-10-CM | POA: Diagnosis not present

## 2022-09-13 DIAGNOSIS — Z1331 Encounter for screening for depression: Secondary | ICD-10-CM | POA: Diagnosis not present

## 2022-09-13 DIAGNOSIS — Z0001 Encounter for general adult medical examination with abnormal findings: Secondary | ICD-10-CM | POA: Diagnosis not present

## 2023-10-23 DIAGNOSIS — Z1331 Encounter for screening for depression: Secondary | ICD-10-CM | POA: Diagnosis not present

## 2023-10-23 DIAGNOSIS — E6609 Other obesity due to excess calories: Secondary | ICD-10-CM | POA: Diagnosis not present

## 2023-10-23 DIAGNOSIS — Z6831 Body mass index (BMI) 31.0-31.9, adult: Secondary | ICD-10-CM | POA: Diagnosis not present

## 2023-10-23 DIAGNOSIS — I10 Essential (primary) hypertension: Secondary | ICD-10-CM | POA: Diagnosis not present

## 2023-10-23 DIAGNOSIS — E782 Mixed hyperlipidemia: Secondary | ICD-10-CM | POA: Diagnosis not present

## 2023-10-23 DIAGNOSIS — Z0001 Encounter for general adult medical examination with abnormal findings: Secondary | ICD-10-CM | POA: Diagnosis not present

## 2024-03-26 IMAGING — DX DG FOREARM 2V*L*
2 series · 2 of 2 positions shown · non-contrast
Comparison: None Available.

CLINICAL DATA: Status post fall.

EXAM:
LEFT FOREARM - 2 VIEW

[forearm ap]
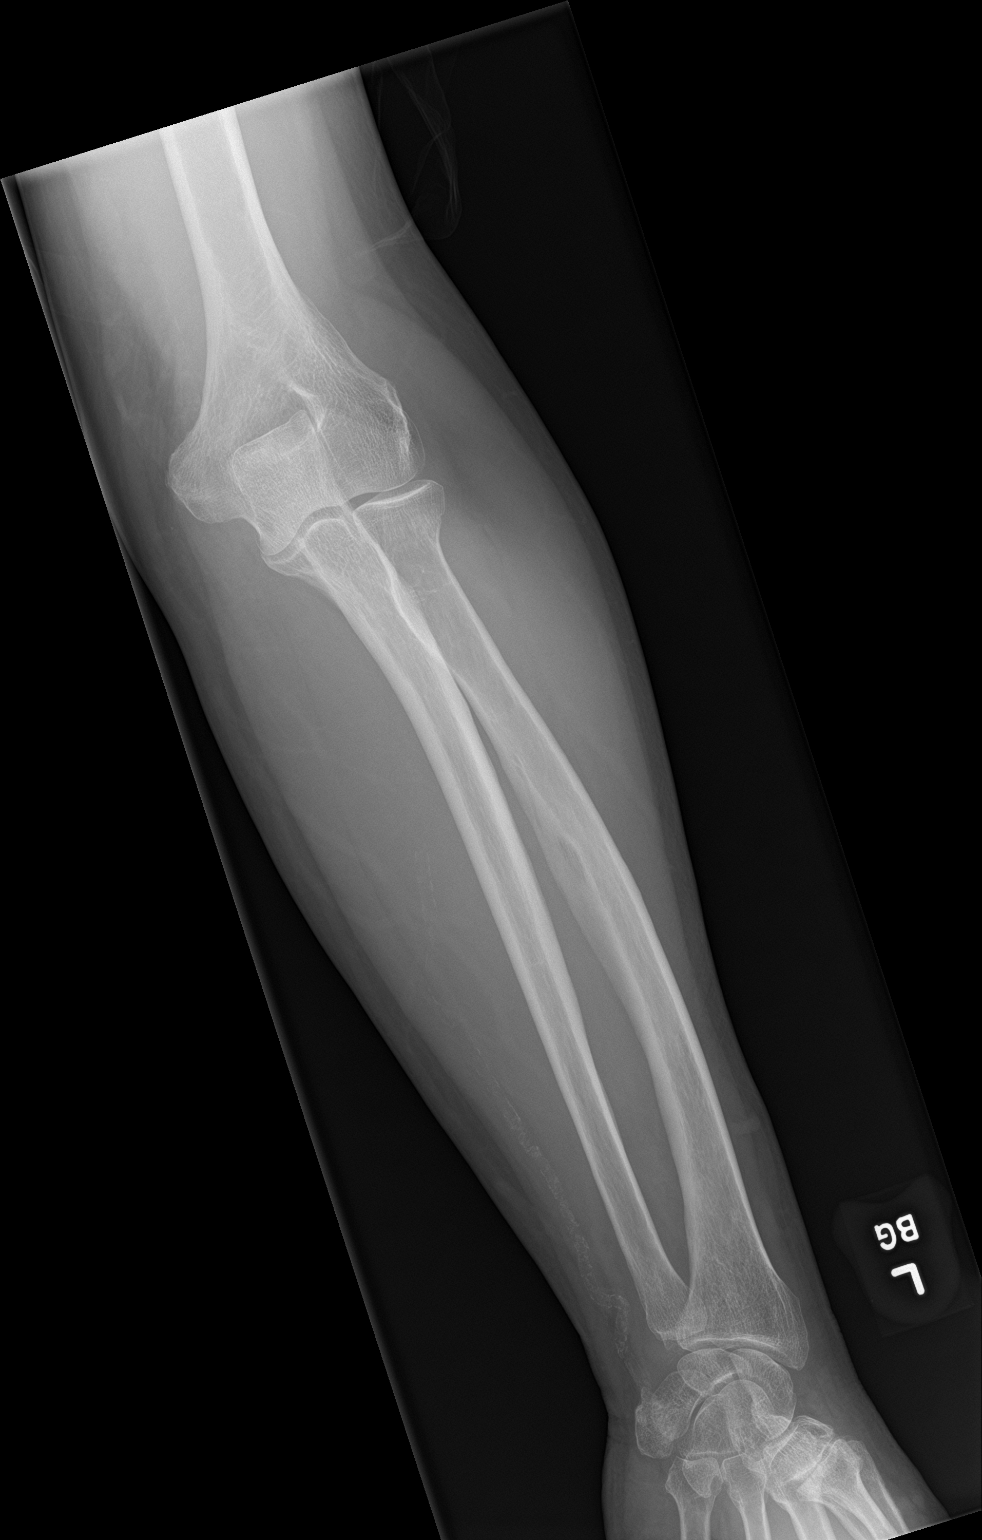

[forearm lat]
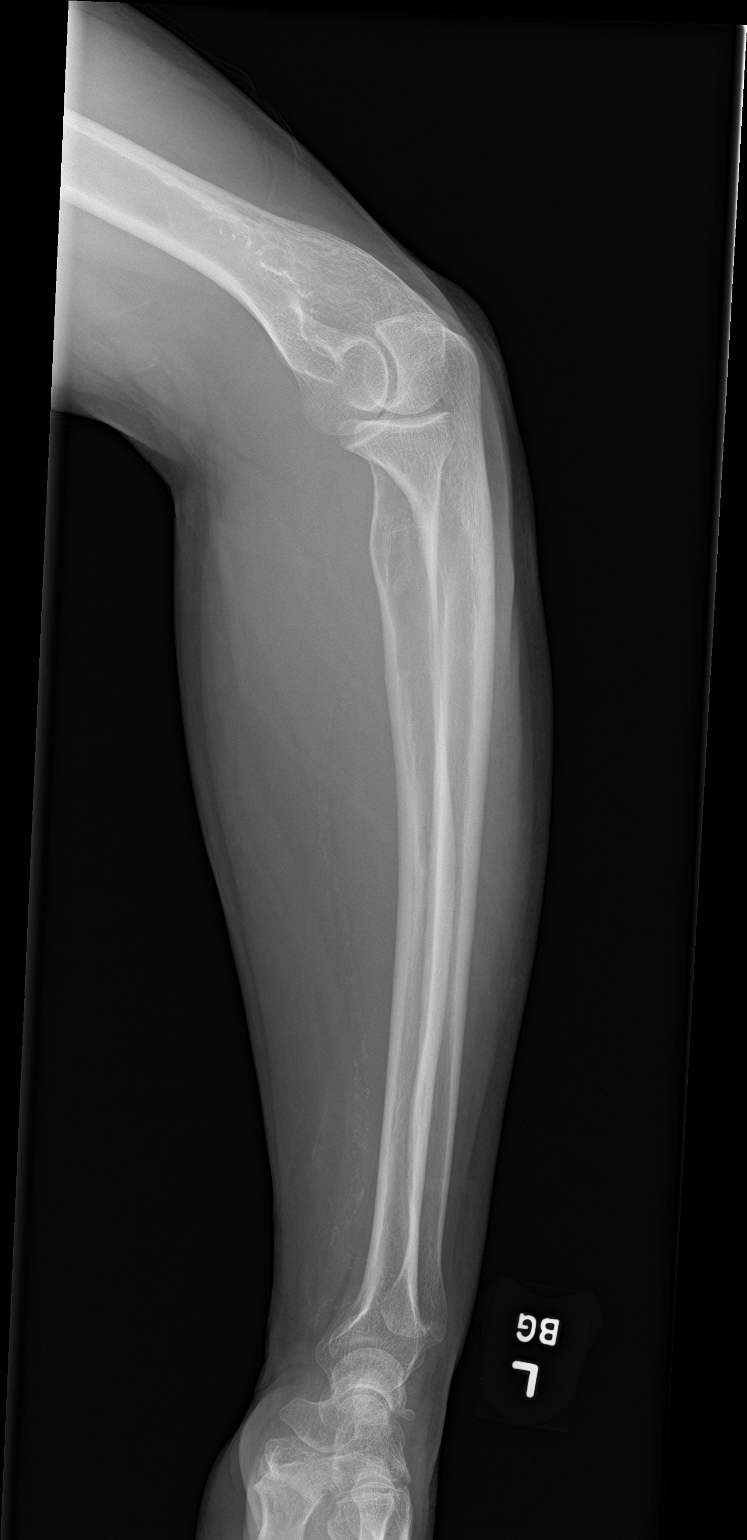

[2 of 2 positions shown; findings below may reference images not displayed]

FINDINGS: An acute nondisplaced fracture is seen involving the neck of the
proximal left radius. There is no evidence of dislocation. Soft
tissues are unremarkable.
IMPRESSION: Nondisplaced fracture of the proximal left radius.

## 2024-03-26 IMAGING — DX DG ELBOW COMPLETE 3+V*L*
4 series · 4 of 4 positions shown · non-contrast
Comparison: None Available.

CLINICAL DATA: Status post fall.

EXAM:
LEFT ELBOW - COMPLETE 3+ VIEW

[elbow ap]
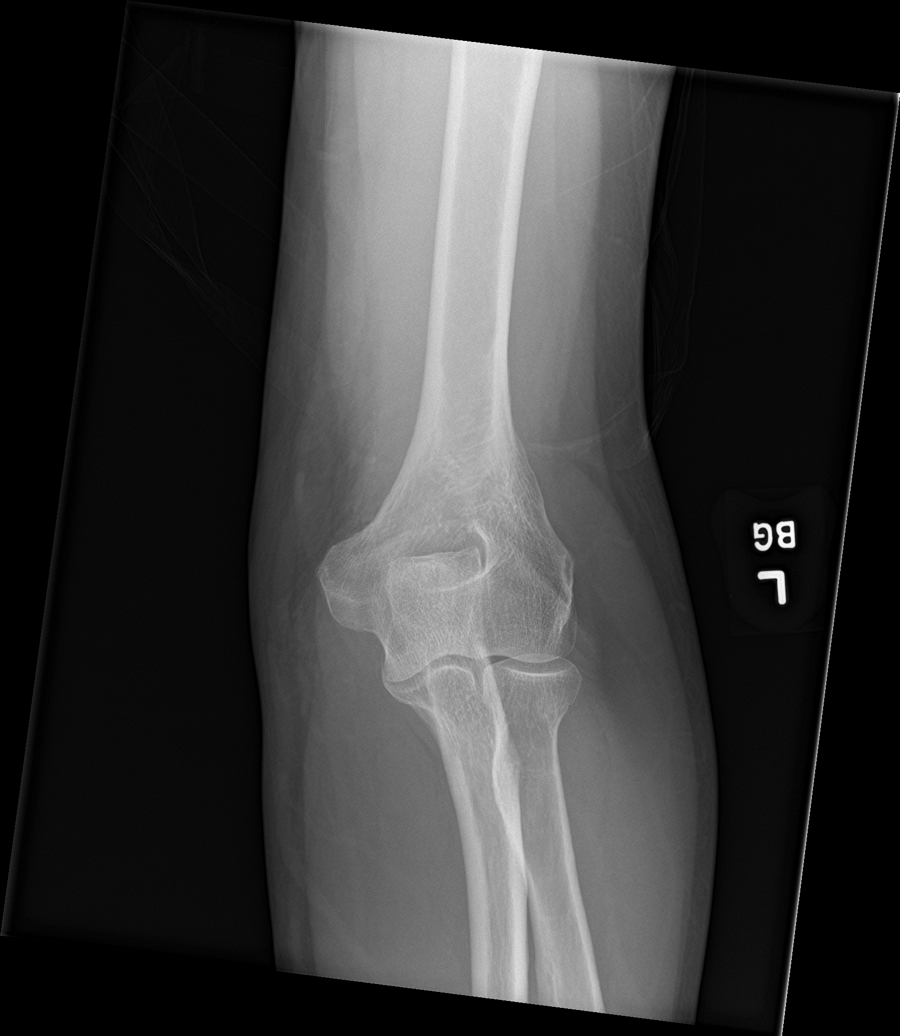

[elbow obl (1 of 2)]
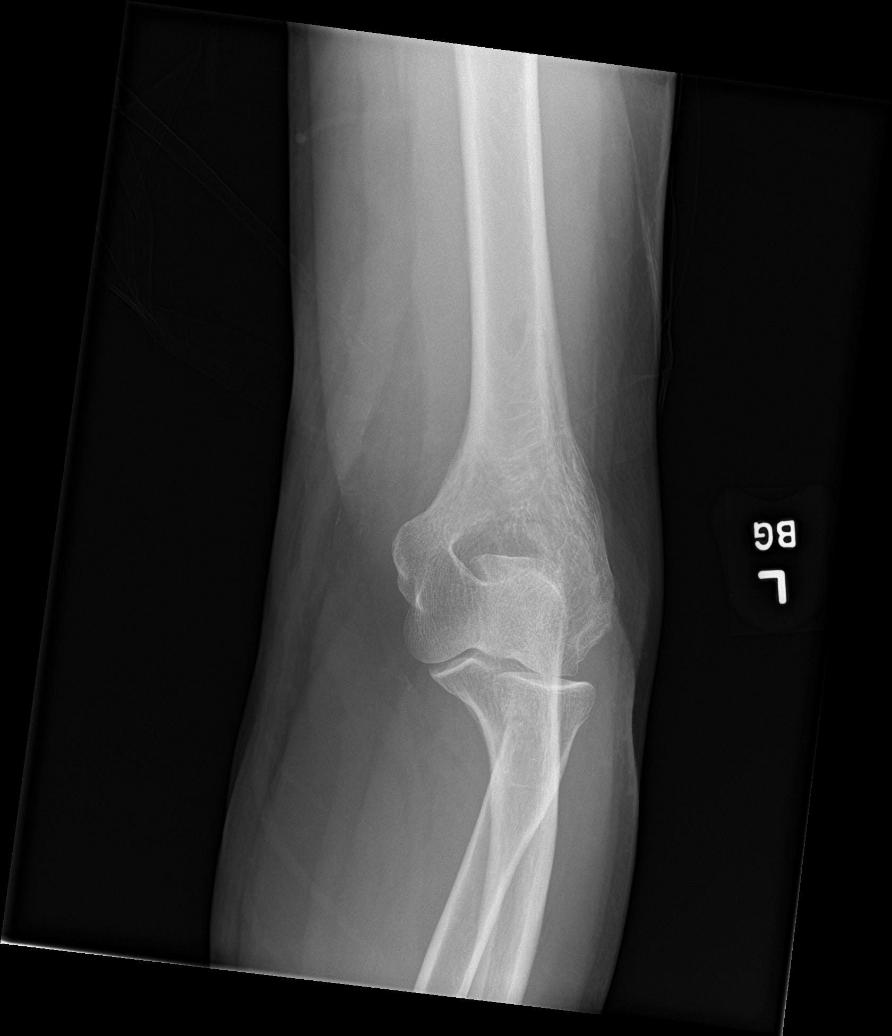

[elbow obl (2 of 2)]
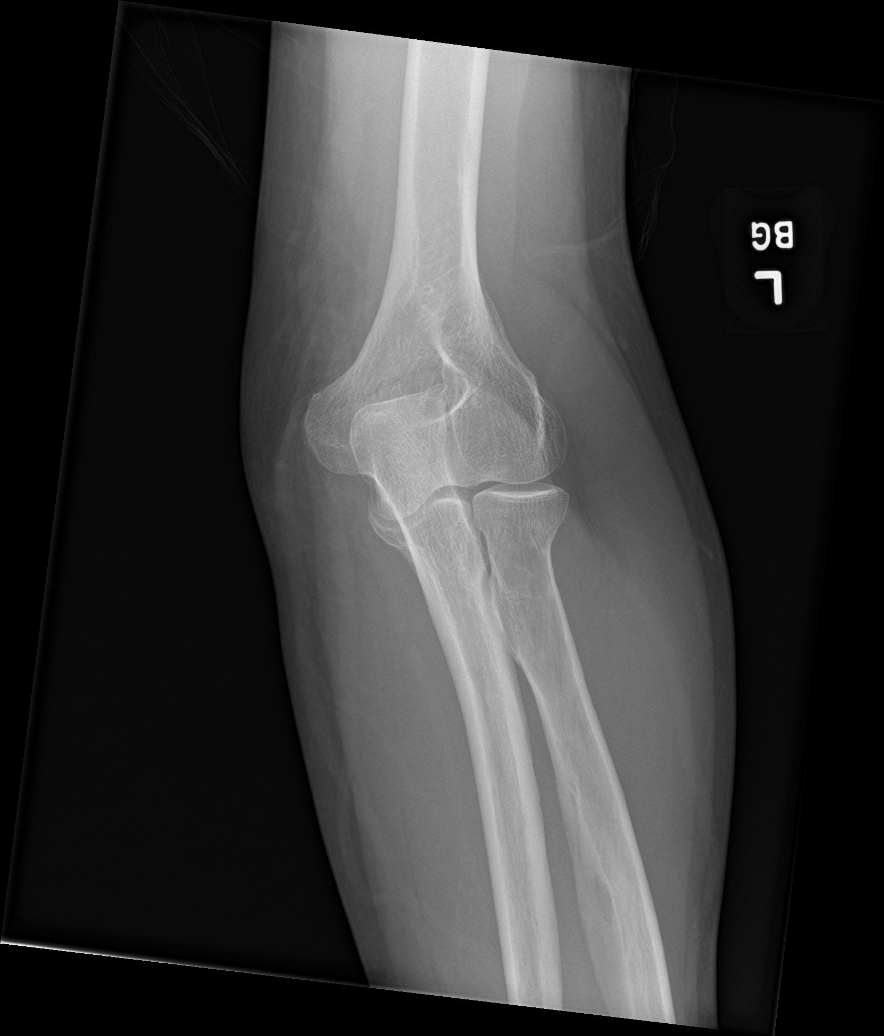

[elbow lat]
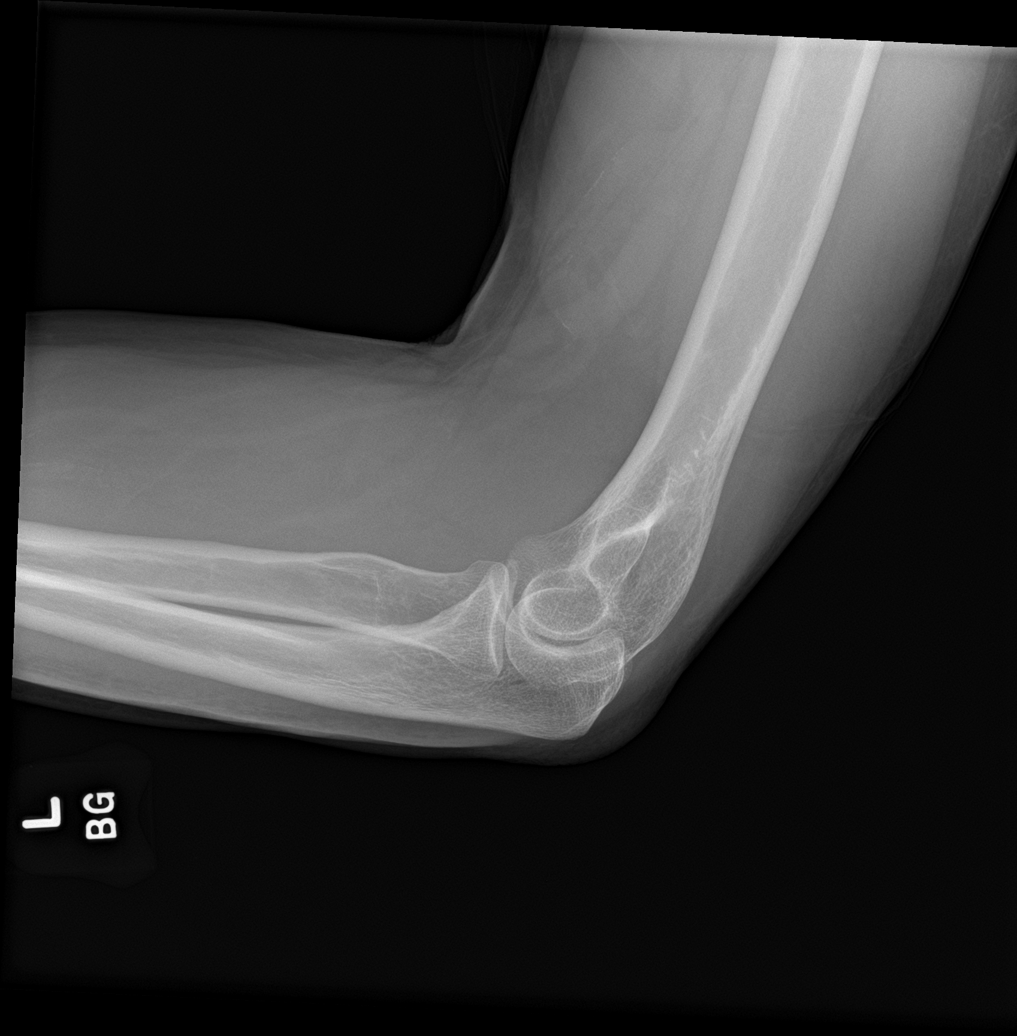

[4 of 4 positions shown; findings below may reference images not displayed]

FINDINGS: An ill-defined area of cortical irregularity is seen involving the
neck of the proximal left radius. There is no evidence of
dislocation. There is no evidence of arthropathy or other focal bone
abnormality. Soft tissues are unremarkable.
IMPRESSION: Findings suspicious for a nondisplaced fracture of the proximal left
radius.
